# Patient Record
Sex: Male | Born: 1970 | Race: Black or African American | Hispanic: No | Marital: Single
Health system: Southern US, Community
[De-identification: ages and names within clinical notes are randomized; demographics above are authoritative.]

## PROBLEM LIST (undated history)

## (undated) DIAGNOSIS — I10 Essential (primary) hypertension: Secondary | ICD-10-CM

---

## 2019-01-05 ENCOUNTER — Emergency Department (HOSPITAL_COMMUNITY)
Admission: EM | Admit: 2019-01-05 | Discharge: 2019-01-05 | Disposition: A | Discharge status: Home / Self Care | Payer: Self-pay | Attending: Emergency Medicine | Admitting: Emergency Medicine

## 2019-01-05 ENCOUNTER — Other Ambulatory Visit: Payer: Self-pay

## 2019-01-05 ENCOUNTER — Emergency Department (HOSPITAL_COMMUNITY): Payer: Self-pay

## 2019-01-05 ENCOUNTER — Encounter (HOSPITAL_COMMUNITY): Payer: Self-pay | Admitting: Emergency Medicine

## 2019-01-05 DIAGNOSIS — I10 Essential (primary) hypertension: Secondary | ICD-10-CM | POA: Insufficient documentation

## 2019-01-05 DIAGNOSIS — R0781 Pleurodynia: Secondary | ICD-10-CM | POA: Insufficient documentation

## 2019-01-05 HISTORY — DX: Essential (primary) hypertension: I10
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
## 2019-01-05 NOTE — ED Notes (Signed)
Pt discharge instructions reviewed with the patient. Pt verbalized understanding of discharge instructions. Pt discharged. 

## 2019-06-10 ENCOUNTER — Other Ambulatory Visit: Payer: Self-pay

## 2019-06-10 ENCOUNTER — Emergency Department (HOSPITAL_COMMUNITY)
Admission: EM | Admit: 2019-06-10 | Discharge: 2019-06-10 | Disposition: A | Discharge status: Home / Self Care | Payer: 59 | Attending: Emergency Medicine | Admitting: Emergency Medicine

## 2019-06-10 ENCOUNTER — Encounter (HOSPITAL_COMMUNITY): Payer: Self-pay | Admitting: Emergency Medicine

## 2019-06-10 DIAGNOSIS — Z202 Contact with and (suspected) exposure to infections with a predominantly sexual mode of transmission: Secondary | ICD-10-CM | POA: Diagnosis not present

## 2019-06-10 DIAGNOSIS — I10 Essential (primary) hypertension: Secondary | ICD-10-CM | POA: Diagnosis not present

## 2019-06-10 DIAGNOSIS — R748 Abnormal levels of other serum enzymes: Secondary | ICD-10-CM | POA: Diagnosis not present

## 2019-06-10 DIAGNOSIS — R319 Hematuria, unspecified: Secondary | ICD-10-CM | POA: Diagnosis present

## 2019-06-10 DIAGNOSIS — R300 Dysuria: Secondary | ICD-10-CM | POA: Insufficient documentation

## 2019-06-10 LAB — CBC
HCT: 44.5 % (ref 39.0–52.0)
Hemoglobin: 14.6 g/dL (ref 13.0–17.0)
MCH: 32.7 pg (ref 26.0–34.0)
MCHC: 32.8 g/dL (ref 30.0–36.0)
MCV: 99.6 fL (ref 80.0–100.0)
Platelets: 262 10*3/uL (ref 150–400)
RBC: 4.47 MIL/uL (ref 4.22–5.81)
RDW: 13.2 % (ref 11.5–15.5)
WBC: 6.3 10*3/uL (ref 4.0–10.5)
nRBC: 0 % (ref 0.0–0.2)

## 2019-06-10 LAB — URINALYSIS, ROUTINE W REFLEX MICROSCOPIC
Bilirubin Urine: NEGATIVE
Glucose, UA: NEGATIVE mg/dL
Ketones, ur: NEGATIVE mg/dL
Nitrite: NEGATIVE
Protein, ur: 100 mg/dL — AB
Specific Gravity, Urine: 1.014 (ref 1.005–1.030)
pH: 6 (ref 5.0–8.0)

## 2019-06-10 LAB — BASIC METABOLIC PANEL
Anion gap: 7 (ref 5–15)
BUN: 19 mg/dL (ref 6–20)
CO2: 21 mmol/L — ABNORMAL LOW (ref 22–32)
Calcium: 8.8 mg/dL — ABNORMAL LOW (ref 8.9–10.3)
Chloride: 109 mmol/L (ref 98–111)
Creatinine, Ser: 1.97 mg/dL — ABNORMAL HIGH (ref 0.61–1.24)
GFR calc Af Amer: 45 mL/min — ABNORMAL LOW (ref >60)
GFR calc non Af Amer: 39 mL/min — ABNORMAL LOW (ref >60)
Glucose, Bld: 110 mg/dL — ABNORMAL HIGH (ref 70–99)
Potassium: 4 mmol/L (ref 3.5–5.1)
Sodium: 137 mmol/L (ref 135–145)

## 2019-06-10 LAB — HIV ANTIBODY (ROUTINE TESTING W REFLEX): HIV Screen 4th Generation wRfx: NONREACTIVE

## 2019-06-10 MED ORDER — METRONIDAZOLE 500 MG PO TABS
2000.0000 mg | ORAL_TABLET | Freq: Once | ORAL | Status: AC
  Administered 2019-06-10: 2000 mg via ORAL
  Filled 2019-06-10: qty 4

## 2019-06-10 MED ORDER — AZITHROMYCIN 250 MG PO TABS
1000.0000 mg | ORAL_TABLET | Freq: Once | ORAL | Status: AC
  Administered 2019-06-10: 12:00:00 1000 mg via ORAL
  Filled 2019-06-10: qty 4

## 2019-06-10 NOTE — ED Triage Notes (Signed)
Pt reports some blood with urination that began this morning, states some dysuria as well, denies any other discharge.

## 2019-06-10 NOTE — Discharge Instructions (Addendum)
You are seen in the ER for bloody urine and pain with urination. This all could be because of infection.  Take the antibiotic as prescribed.  If your symptoms do not improve despite antibiotics, then follow-up with urologist as requested.  Additionally, you were noted to have elevated kidney enzymes.  We do not know what to make of it.  Please follow-up with [facility] doctors to ensure that your kidneys are functioning properly.
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
## 2019-06-11 LAB — GC/CHLAMYDIA PROBE AMP (~~LOC~~) NOT AT ARMC
Chlamydia: NEGATIVE
Comment: NEGATIVE
Comment: NORMAL
Neisseria Gonorrhea: NEGATIVE

## 2019-06-11 LAB — URINE CULTURE: Culture: NO GROWTH

## 2019-06-11 LAB — RPR
RPR Ser Ql: REACTIVE — AB
RPR Titer: 1:2 {titer}

## 2019-06-12 LAB — T.PALLIDUM AB, TOTAL: T Pallidum Abs: REACTIVE — AB

## 2020-01-13 ENCOUNTER — Other Ambulatory Visit: Payer: Self-pay

## 2020-01-13 ENCOUNTER — Emergency Department (HOSPITAL_COMMUNITY)
Admission: EM | Admit: 2020-01-13 | Discharge: 2020-01-14 | Disposition: A | Discharge status: Home / Self Care | Payer: 59 | Attending: Emergency Medicine | Admitting: Emergency Medicine

## 2020-01-13 ENCOUNTER — Emergency Department (HOSPITAL_COMMUNITY): Payer: 59

## 2020-01-13 DIAGNOSIS — H5711 Ocular pain, right eye: Secondary | ICD-10-CM | POA: Diagnosis not present

## 2020-01-13 DIAGNOSIS — H539 Unspecified visual disturbance: Secondary | ICD-10-CM

## 2020-01-13 DIAGNOSIS — I10 Essential (primary) hypertension: Secondary | ICD-10-CM | POA: Insufficient documentation

## 2020-01-13 DIAGNOSIS — M25562 Pain in left knee: Secondary | ICD-10-CM | POA: Diagnosis present
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
## 2020-01-14 MED ORDER — NAPROXEN 500 MG PO TABS: 500.0000 mg | ORAL_TABLET | Freq: Two times a day (BID) | ORAL | 0 refills | Status: AC

## 2020-01-14 NOTE — ED Notes (Signed)
Received pt from lobby.  
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
## 2020-01-14 NOTE — Discharge Instructions (Addendum)
Call the office of Dr. Wynelle Link in the morning to schedule an office visit for reevaluation of your right eye complaints.  We recommend icing of your left knee 3-4 times per day.  Take naproxen as prescribed for pain control.  Try to cut down on your salt/sodium intake. There is often a high salt content in canned foods, processed foods or microwave meals, and fast food.

## 2020-07-22 ENCOUNTER — Emergency Department (HOSPITAL_COMMUNITY)
Admission: EM | Admit: 2020-07-22 | Discharge: 2020-07-23 | Disposition: A | Discharge status: Home / Self Care | Payer: 59 | Attending: Emergency Medicine | Admitting: Emergency Medicine

## 2020-07-22 DIAGNOSIS — Z5321 Procedure and treatment not carried out due to patient leaving prior to being seen by health care provider: Secondary | ICD-10-CM | POA: Insufficient documentation

## 2020-07-22 DIAGNOSIS — R21 Rash and other nonspecific skin eruption: Secondary | ICD-10-CM | POA: Diagnosis not present

## 2020-07-22 NOTE — ED Triage Notes (Signed)
Pt c/o rash x4-5 days, states unsure if related to heat or not. Faint bumps/dry skin noted on BUE. Denies additional symptoms

## 2020-07-23 NOTE — ED Notes (Signed)
Pt called for vitals, no response and pt is not seen in the waiting area.

## 2021-04-29 IMAGING — CR DG KNEE COMPLETE 4+V*L*
4 series · 4 of 4 positions shown · non-contrast
Comparison: None.

CLINICAL DATA: Pain and swelling

EXAM:
LEFT KNEE - COMPLETE 4+ VIEW

[knee ap]
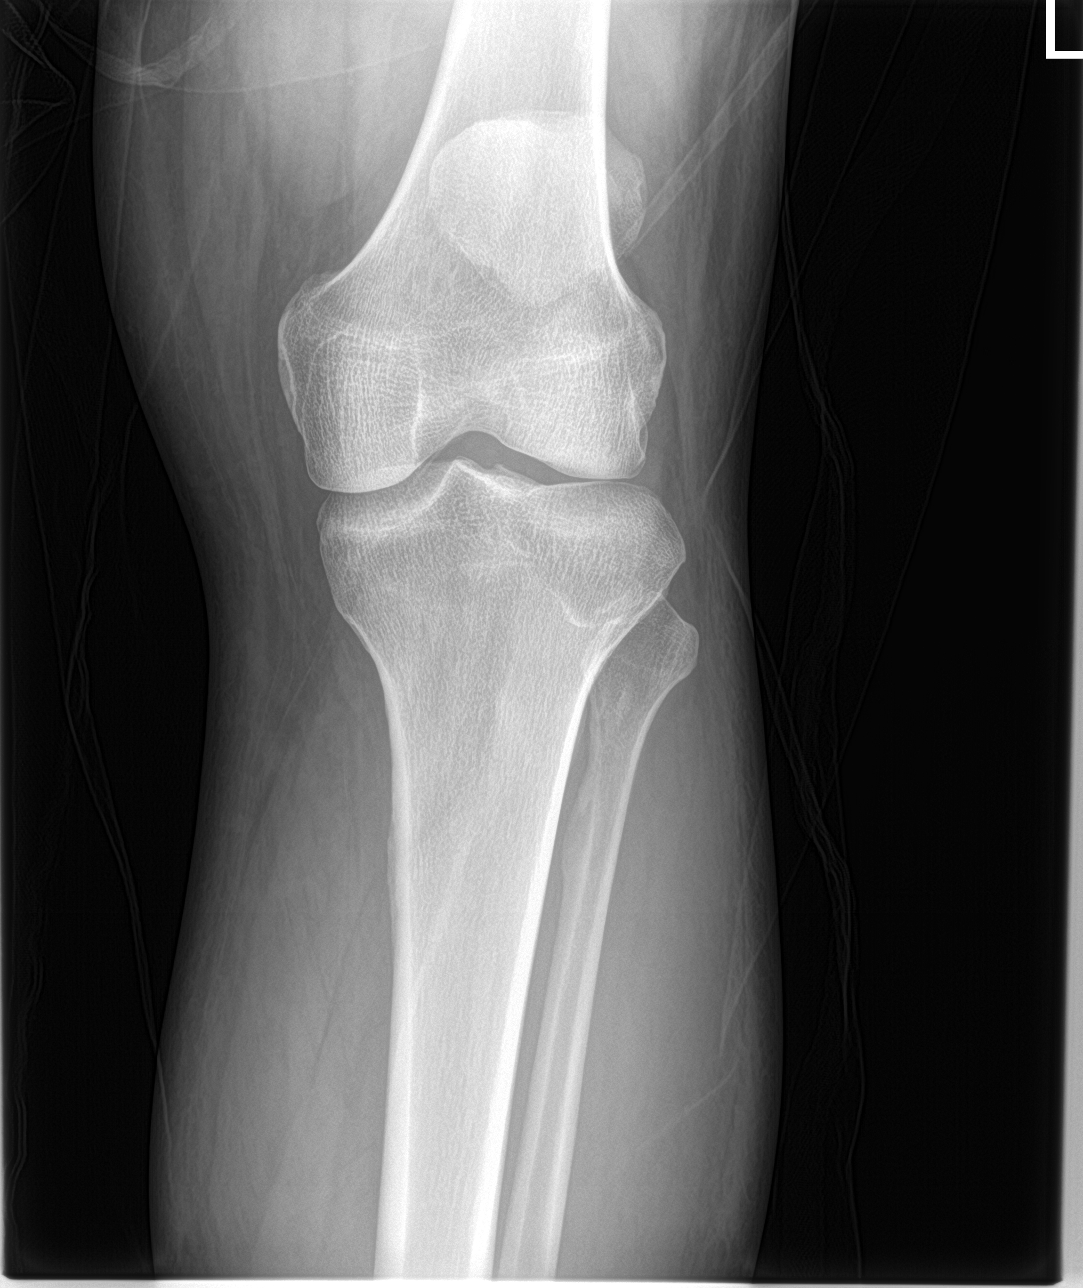

[knee lat]
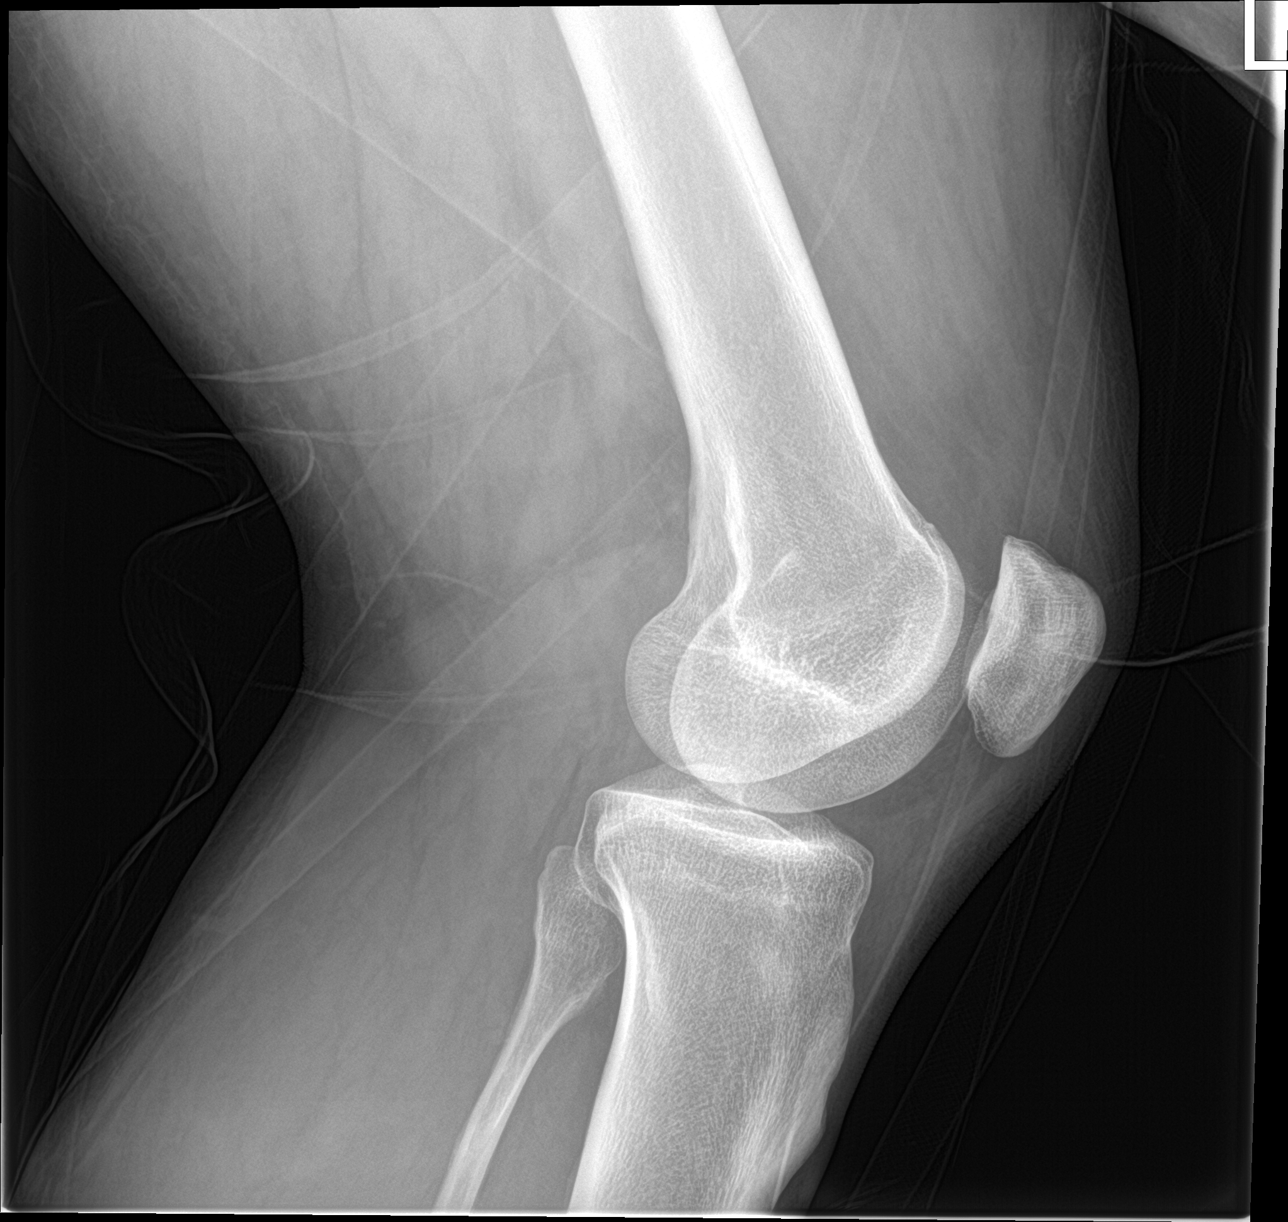

[knee obl (1 of 2)]
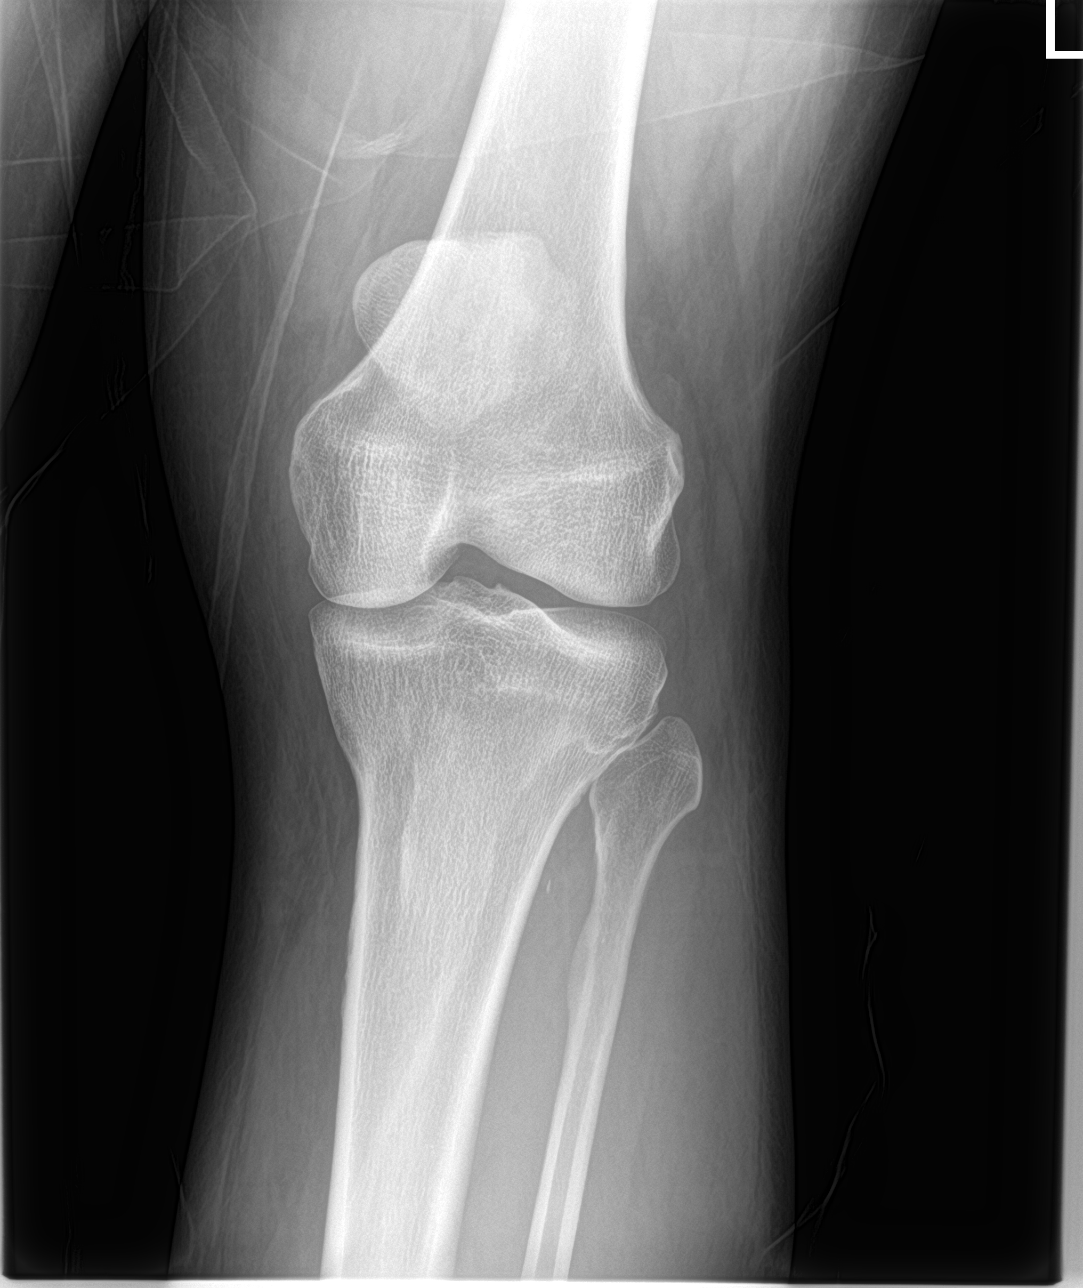

[knee obl (2 of 2)]
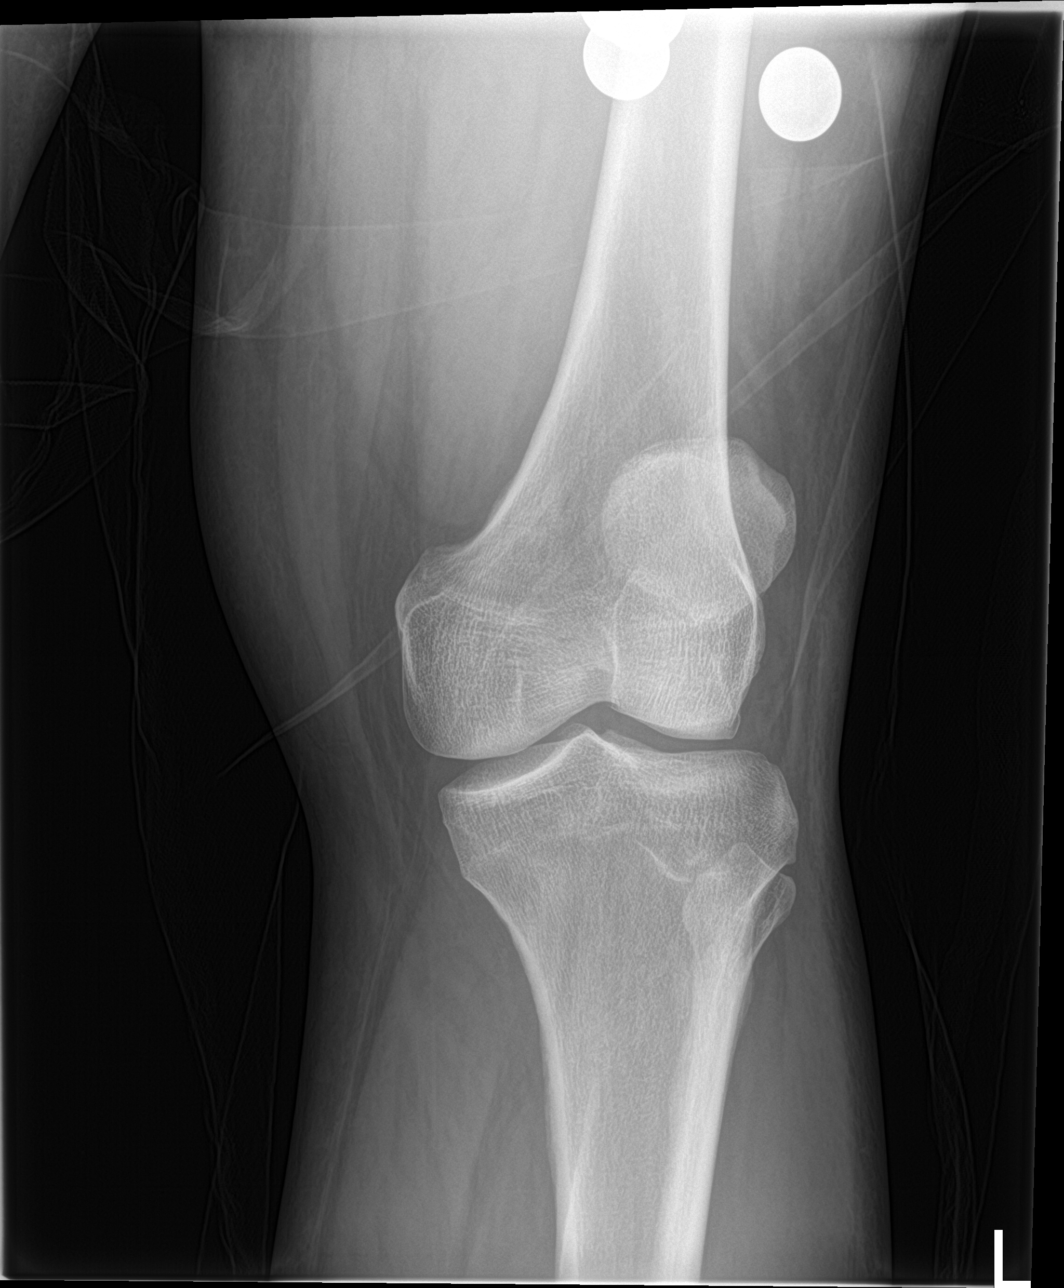

[4 of 4 positions shown; findings below may reference images not displayed]

FINDINGS: No fracture or malalignment. The joint spaces are maintained.
Probable small knee effusion.
IMPRESSION: No acute osseous abnormality. Probable small knee effusion.
